# Patient Record
Sex: Male | Born: 1950 | Race: White | Hispanic: No | Marital: Single
Health system: Southern US, Community
[De-identification: ages and names within clinical notes are randomized; demographics above are authoritative.]

---

## 2016-02-18 ENCOUNTER — Other Ambulatory Visit (HOSPITAL_COMMUNITY): Payer: Medicare Other

## 2016-02-18 ENCOUNTER — Inpatient Hospital Stay
Admission: RE | Admit: 2016-02-18 | Discharge: 2016-03-27 | Disposition: A | Discharge status: Skilled Nursing Facility | Payer: Medicare Other | Source: Other Acute Inpatient Hospital | Attending: Urology | Admitting: Urology

## 2016-02-18 ENCOUNTER — Ambulatory Visit (HOSPITAL_COMMUNITY)
Admission: AD | Admit: 2016-02-18 | Discharge: 2016-02-18 | Disposition: A | Discharge status: Home / Self Care | Payer: Medicare Other | Source: Other Acute Inpatient Hospital | Attending: Internal Medicine | Admitting: Internal Medicine

## 2016-02-18 DIAGNOSIS — A419 Sepsis, unspecified organism: Secondary | ICD-10-CM

## 2016-02-18 DIAGNOSIS — Z01818 Encounter for other preprocedural examination: Secondary | ICD-10-CM

## 2016-02-18 DIAGNOSIS — Z931 Gastrostomy status: Secondary | ICD-10-CM

## 2016-02-18 DIAGNOSIS — Z978 Presence of other specified devices: Secondary | ICD-10-CM

## 2016-02-18 DIAGNOSIS — I639 Cerebral infarction, unspecified: Secondary | ICD-10-CM | POA: Diagnosis present

## 2016-02-18 DIAGNOSIS — R1110 Vomiting, unspecified: Secondary | ICD-10-CM

## 2016-02-18 DIAGNOSIS — J189 Pneumonia, unspecified organism: Secondary | ICD-10-CM

## 2016-02-18 LAB — BLOOD GAS, ARTERIAL
Acid-Base Excess: 0.7 mmol/L (ref 0.0–2.0)
Bicarbonate: 24.6 mmol/L (ref 20.0–28.0)
DRAWN BY: 24485
FIO2: 35
O2 SAT: 97.8 %
PATIENT TEMPERATURE: 98.6
PCO2 ART: 38.1 mmHg (ref 32.0–48.0)
PEEP: 5 cmH2O
PH ART: 7.427 (ref 7.350–7.450)
RATE: 14 resp/min
VT: 500 mL
pO2, Arterial: 101 mmHg (ref 83.0–108.0)

## 2016-02-18 LAB — URINALYSIS, ROUTINE W REFLEX MICROSCOPIC
BACTERIA UA: NONE SEEN
BILIRUBIN URINE: NEGATIVE
Glucose, UA: NEGATIVE mg/dL
Ketones, ur: NEGATIVE mg/dL
Leukocytes, UA: NEGATIVE
NITRITE: NEGATIVE
PH: 6 (ref 5.0–8.0)
Protein, ur: NEGATIVE mg/dL
SPECIFIC GRAVITY, URINE: 1.02 (ref 1.005–1.030)
Squamous Epithelial / LPF: NONE SEEN

## 2016-02-18 MED ORDER — IOPAMIDOL (ISOVUE-300) INJECTION 61%
INTRAVENOUS | Status: AC
  Administered 2016-02-18: 40 mL via GASTROSTOMY
  Filled 2016-02-18: qty 50

## 2016-02-19 LAB — COMPREHENSIVE METABOLIC PANEL
ALK PHOS: 47 U/L (ref 38–126)
ALT: 80 U/L — ABNORMAL HIGH (ref 17–63)
ANION GAP: 13 (ref 5–15)
AST: 84 U/L — ABNORMAL HIGH (ref 15–41)
Albumin: 3.3 g/dL — ABNORMAL LOW (ref 3.5–5.0)
BILIRUBIN TOTAL: 0.8 mg/dL (ref 0.3–1.2)
BUN: 28 mg/dL — ABNORMAL HIGH (ref 6–20)
CALCIUM: 9.7 mg/dL (ref 8.9–10.3)
CO2: 24 mmol/L (ref 22–32)
Chloride: 113 mmol/L — ABNORMAL HIGH (ref 101–111)
Creatinine, Ser: 0.88 mg/dL (ref 0.61–1.24)
Glucose, Bld: 151 mg/dL — ABNORMAL HIGH (ref 65–99)
Potassium: 4 mmol/L (ref 3.5–5.1)
SODIUM: 150 mmol/L — AB (ref 135–145)
TOTAL PROTEIN: 7.5 g/dL (ref 6.5–8.1)

## 2016-02-19 LAB — CBC WITH DIFFERENTIAL/PLATELET
BASOS ABS: 0.1 10*3/uL (ref 0.0–0.1)
Basophils Relative: 0 %
EOS PCT: 2 %
Eosinophils Absolute: 0.3 10*3/uL (ref 0.0–0.7)
HCT: 48.7 % (ref 39.0–52.0)
Hemoglobin: 15.4 g/dL (ref 13.0–17.0)
LYMPHS PCT: 12 %
Lymphs Abs: 1.7 10*3/uL (ref 0.7–4.0)
MCH: 31.8 pg (ref 26.0–34.0)
MCHC: 31.6 g/dL (ref 30.0–36.0)
MCV: 100.6 fL — AB (ref 78.0–100.0)
MONO ABS: 1.1 10*3/uL — AB (ref 0.1–1.0)
Monocytes Relative: 7 %
Neutro Abs: 11.5 10*3/uL — ABNORMAL HIGH (ref 1.7–7.7)
Neutrophils Relative %: 79 %
PLATELETS: 321 10*3/uL (ref 150–400)
RBC: 4.84 MIL/uL (ref 4.22–5.81)
RDW: 13.7 % (ref 11.5–15.5)
WBC: 14.7 10*3/uL — ABNORMAL HIGH (ref 4.0–10.5)

## 2016-02-19 LAB — MAGNESIUM: Magnesium: 2.5 mg/dL — ABNORMAL HIGH (ref 1.7–2.4)

## 2016-02-19 LAB — PROTIME-INR
INR: 1.01
Prothrombin Time: 13.3 seconds (ref 11.4–15.2)

## 2016-02-19 LAB — TSH: TSH: 1.998 u[IU]/mL (ref 0.350–4.500)

## 2016-02-19 LAB — PHOSPHORUS: PHOSPHORUS: 2.9 mg/dL (ref 2.5–4.6)

## 2016-02-20 LAB — HEMOGLOBIN A1C
HEMOGLOBIN A1C: 5.6 % (ref 4.8–5.6)
MEAN PLASMA GLUCOSE: 114 mg/dL

## 2016-02-20 LAB — URINE CULTURE: CULTURE: NO GROWTH

## 2016-02-20 LAB — BASIC METABOLIC PANEL
Anion gap: 10 (ref 5–15)
BUN: 26 mg/dL — AB (ref 6–20)
CALCIUM: 9.7 mg/dL (ref 8.9–10.3)
CO2: 24 mmol/L (ref 22–32)
CREATININE: 0.8 mg/dL (ref 0.61–1.24)
Chloride: 115 mmol/L — ABNORMAL HIGH (ref 101–111)
GFR calc Af Amer: >60 mL/min (ref >60)
Glucose, Bld: 174 mg/dL — ABNORMAL HIGH (ref 65–99)
POTASSIUM: 4 mmol/L (ref 3.5–5.1)
SODIUM: 149 mmol/L — AB (ref 135–145)

## 2016-02-21 LAB — RENAL FUNCTION PANEL
ANION GAP: 13 (ref 5–15)
Albumin: 3.1 g/dL — ABNORMAL LOW (ref 3.5–5.0)
BUN: 25 mg/dL — ABNORMAL HIGH (ref 6–20)
CO2: 24 mmol/L (ref 22–32)
Calcium: 9.4 mg/dL (ref 8.9–10.3)
Chloride: 109 mmol/L (ref 101–111)
Creatinine, Ser: 0.77 mg/dL (ref 0.61–1.24)
GFR calc non Af Amer: >60 mL/min (ref >60)
GLUCOSE: 161 mg/dL — AB (ref 65–99)
PHOSPHORUS: 2.6 mg/dL (ref 2.5–4.6)
POTASSIUM: 3.9 mmol/L (ref 3.5–5.1)
Sodium: 146 mmol/L — ABNORMAL HIGH (ref 135–145)

## 2016-02-21 LAB — CBC WITH DIFFERENTIAL/PLATELET
Basophils Absolute: 0 10*3/uL (ref 0.0–0.1)
Basophils Relative: 0 %
EOS ABS: 0.5 10*3/uL (ref 0.0–0.7)
Eosinophils Relative: 3 %
HEMATOCRIT: 47.9 % (ref 39.0–52.0)
Hemoglobin: 15.4 g/dL (ref 13.0–17.0)
LYMPHS ABS: 1.9 10*3/uL (ref 0.7–4.0)
LYMPHS PCT: 13 %
MCH: 32 pg (ref 26.0–34.0)
MCHC: 32.2 g/dL (ref 30.0–36.0)
MCV: 99.4 fL (ref 78.0–100.0)
Monocytes Absolute: 1.1 10*3/uL — ABNORMAL HIGH (ref 0.1–1.0)
Monocytes Relative: 8 %
NEUTROS ABS: 11.2 10*3/uL — AB (ref 1.7–7.7)
Neutrophils Relative %: 76 %
Platelets: 342 10*3/uL (ref 150–400)
RBC: 4.82 MIL/uL (ref 4.22–5.81)
RDW: 13.4 % (ref 11.5–15.5)
WBC: 14.7 10*3/uL — ABNORMAL HIGH (ref 4.0–10.5)

## 2016-02-21 LAB — MAGNESIUM: Magnesium: 2.3 mg/dL (ref 1.7–2.4)

## 2016-02-22 LAB — BASIC METABOLIC PANEL
Anion gap: 13 (ref 5–15)
BUN: 25 mg/dL — AB (ref 6–20)
CALCIUM: 9.5 mg/dL (ref 8.9–10.3)
CO2: 25 mmol/L (ref 22–32)
CREATININE: 0.87 mg/dL (ref 0.61–1.24)
Chloride: 104 mmol/L (ref 101–111)
GFR calc Af Amer: >60 mL/min (ref >60)
GLUCOSE: 182 mg/dL — AB (ref 65–99)
Potassium: 4 mmol/L (ref 3.5–5.1)
Sodium: 142 mmol/L (ref 135–145)

## 2016-02-23 ENCOUNTER — Other Ambulatory Visit (HOSPITAL_COMMUNITY): Payer: Medicare Other

## 2016-02-23 LAB — CULTURE, RESPIRATORY

## 2016-02-23 LAB — CULTURE, RESPIRATORY W GRAM STAIN: Culture: NORMAL

## 2016-02-24 ENCOUNTER — Other Ambulatory Visit (HOSPITAL_COMMUNITY): Payer: Medicare Other

## 2016-02-24 LAB — CBC
HEMATOCRIT: 47.2 % (ref 39.0–52.0)
Hemoglobin: 14.8 g/dL (ref 13.0–17.0)
MCH: 31.7 pg (ref 26.0–34.0)
MCHC: 31.4 g/dL (ref 30.0–36.0)
MCV: 101.1 fL — AB (ref 78.0–100.0)
Platelets: 324 10*3/uL (ref 150–400)
RBC: 4.67 MIL/uL (ref 4.22–5.81)
RDW: 13.7 % (ref 11.5–15.5)
WBC: 19.5 10*3/uL — AB (ref 4.0–10.5)

## 2016-02-24 LAB — BASIC METABOLIC PANEL
Anion gap: 11 (ref 5–15)
BUN: 54 mg/dL — ABNORMAL HIGH (ref 6–20)
CHLORIDE: 109 mmol/L (ref 101–111)
CO2: 23 mmol/L (ref 22–32)
CREATININE: 1.13 mg/dL (ref 0.61–1.24)
Calcium: 9.2 mg/dL (ref 8.9–10.3)
GFR calc non Af Amer: >60 mL/min (ref >60)
Glucose, Bld: 232 mg/dL — ABNORMAL HIGH (ref 65–99)
POTASSIUM: 4.7 mmol/L (ref 3.5–5.1)
SODIUM: 143 mmol/L (ref 135–145)

## 2016-02-24 LAB — C DIFFICILE QUICK SCREEN W PCR REFLEX
C DIFFICILE (CDIFF) INTERP: NOT DETECTED
C Diff antigen: NEGATIVE
C Diff toxin: NEGATIVE

## 2016-02-25 ENCOUNTER — Institutional Professional Consult (permissible substitution) (HOSPITAL_COMMUNITY): Payer: Medicare Other

## 2016-02-25 LAB — CBC WITH DIFFERENTIAL/PLATELET
BASOS ABS: 0.1 10*3/uL (ref 0.0–0.1)
Basophils Relative: 0 %
EOS ABS: 0.9 10*3/uL — AB (ref 0.0–0.7)
Eosinophils Relative: 5 %
HCT: 45.7 % (ref 39.0–52.0)
HEMOGLOBIN: 14.3 g/dL (ref 13.0–17.0)
Lymphocytes Relative: 6 %
Lymphs Abs: 1.3 10*3/uL (ref 0.7–4.0)
MCH: 31.8 pg (ref 26.0–34.0)
MCHC: 31.3 g/dL (ref 30.0–36.0)
MCV: 101.6 fL — ABNORMAL HIGH (ref 78.0–100.0)
Monocytes Absolute: 1.9 10*3/uL — ABNORMAL HIGH (ref 0.1–1.0)
Monocytes Relative: 10 %
NEUTROS PCT: 79 %
Neutro Abs: 15.5 10*3/uL — ABNORMAL HIGH (ref 1.7–7.7)
PLATELETS: 315 10*3/uL (ref 150–400)
RBC: 4.5 MIL/uL (ref 4.22–5.81)
RDW: 13.7 % (ref 11.5–15.5)
WBC: 19.5 10*3/uL — AB (ref 4.0–10.5)

## 2016-02-25 LAB — BASIC METABOLIC PANEL
ANION GAP: 12 (ref 5–15)
BUN: 69 mg/dL — ABNORMAL HIGH (ref 6–20)
CHLORIDE: 111 mmol/L (ref 101–111)
CO2: 25 mmol/L (ref 22–32)
Calcium: 9.3 mg/dL (ref 8.9–10.3)
Creatinine, Ser: 1.55 mg/dL — ABNORMAL HIGH (ref 0.61–1.24)
GFR calc Af Amer: 53 mL/min — ABNORMAL LOW (ref >60)
GFR calc non Af Amer: 45 mL/min — ABNORMAL LOW (ref >60)
Glucose, Bld: 241 mg/dL — ABNORMAL HIGH (ref 65–99)
POTASSIUM: 4.9 mmol/L (ref 3.5–5.1)
SODIUM: 148 mmol/L — AB (ref 135–145)

## 2016-02-25 LAB — PHOSPHORUS: PHOSPHORUS: 4.1 mg/dL (ref 2.5–4.6)

## 2016-02-25 LAB — MAGNESIUM: MAGNESIUM: 3.4 mg/dL — AB (ref 1.7–2.4)

## 2016-02-26 LAB — RENAL FUNCTION PANEL
ALBUMIN: 2.8 g/dL — AB (ref 3.5–5.0)
Albumin: 2.8 g/dL — ABNORMAL LOW (ref 3.5–5.0)
Anion gap: 13 (ref 5–15)
Anion gap: 13 (ref 5–15)
BUN: 79 mg/dL — AB (ref 6–20)
BUN: 84 mg/dL — AB (ref 6–20)
CALCIUM: 8.9 mg/dL (ref 8.9–10.3)
CHLORIDE: 110 mmol/L (ref 101–111)
CO2: 22 mmol/L (ref 22–32)
CO2: 24 mmol/L (ref 22–32)
CREATININE: 1.95 mg/dL — AB (ref 0.61–1.24)
CREATININE: 2.06 mg/dL — AB (ref 0.61–1.24)
Calcium: 9.2 mg/dL (ref 8.9–10.3)
Chloride: 111 mmol/L (ref 101–111)
GFR calc Af Amer: 40 mL/min — ABNORMAL LOW (ref >60)
GFR calc Af Amer: 37 mL/min — ABNORMAL LOW (ref >60)
GFR calc non Af Amer: 32 mL/min — ABNORMAL LOW (ref >60)
GFR, EST NON AFRICAN AMERICAN: 34 mL/min — AB (ref >60)
GLUCOSE: 239 mg/dL — AB (ref 65–99)
Glucose, Bld: 223 mg/dL — ABNORMAL HIGH (ref 65–99)
PHOSPHORUS: 4.4 mg/dL (ref 2.5–4.6)
PHOSPHORUS: 4.3 mg/dL (ref 2.5–4.6)
POTASSIUM: 5.1 mmol/L (ref 3.5–5.1)
Potassium: 5 mmol/L (ref 3.5–5.1)
Sodium: 146 mmol/L — ABNORMAL HIGH (ref 135–145)
Sodium: 147 mmol/L — ABNORMAL HIGH (ref 135–145)

## 2016-02-26 LAB — URINALYSIS, ROUTINE W REFLEX MICROSCOPIC
BACTERIA UA: NONE SEEN
Bilirubin Urine: NEGATIVE
Glucose, UA: 50 mg/dL — AB
Ketones, ur: NEGATIVE mg/dL
LEUKOCYTES UA: NEGATIVE
Nitrite: NEGATIVE
PROTEIN: NEGATIVE mg/dL
SQUAMOUS EPITHELIAL / LPF: NONE SEEN
Specific Gravity, Urine: 1.014 (ref 1.005–1.030)
pH: 6 (ref 5.0–8.0)

## 2016-02-26 LAB — MAGNESIUM: MAGNESIUM: 3.2 mg/dL — AB (ref 1.7–2.4)

## 2016-02-27 LAB — CULTURE, RESPIRATORY: CULTURE: NORMAL

## 2016-02-27 LAB — MAGNESIUM: Magnesium: 2.9 mg/dL — ABNORMAL HIGH (ref 1.7–2.4)

## 2016-02-27 LAB — C DIFFICILE QUICK SCREEN W PCR REFLEX
C DIFFICILE (CDIFF) INTERP: NOT DETECTED
C Diff antigen: NEGATIVE
C Diff toxin: NEGATIVE

## 2016-02-27 LAB — RENAL FUNCTION PANEL
ALBUMIN: 2.7 g/dL — AB (ref 3.5–5.0)
ANION GAP: 13 (ref 5–15)
BUN: 83 mg/dL — AB (ref 6–20)
CHLORIDE: 110 mmol/L (ref 101–111)
CO2: 24 mmol/L (ref 22–32)
Calcium: 9 mg/dL (ref 8.9–10.3)
Creatinine, Ser: 1.99 mg/dL — ABNORMAL HIGH (ref 0.61–1.24)
GFR calc Af Amer: 39 mL/min — ABNORMAL LOW (ref >60)
GFR calc non Af Amer: 33 mL/min — ABNORMAL LOW (ref >60)
GLUCOSE: 239 mg/dL — AB (ref 65–99)
PHOSPHORUS: 3.4 mg/dL (ref 2.5–4.6)
POTASSIUM: 5.1 mmol/L (ref 3.5–5.1)
Sodium: 147 mmol/L — ABNORMAL HIGH (ref 135–145)

## 2016-02-27 LAB — CULTURE, RESPIRATORY W GRAM STAIN

## 2016-02-27 LAB — URINE CULTURE: Culture: NO GROWTH

## 2016-02-28 LAB — BLOOD CULTURE ID PANEL (REFLEXED)
ACINETOBACTER BAUMANNII: NOT DETECTED
CANDIDA TROPICALIS: NOT DETECTED
Candida albicans: NOT DETECTED
Candida glabrata: NOT DETECTED
Candida krusei: NOT DETECTED
Candida parapsilosis: NOT DETECTED
ENTEROBACTERIACEAE SPECIES: NOT DETECTED
Enterobacter cloacae complex: NOT DETECTED
Enterococcus species: NOT DETECTED
Escherichia coli: NOT DETECTED
HAEMOPHILUS INFLUENZAE: NOT DETECTED
KLEBSIELLA OXYTOCA: NOT DETECTED
Klebsiella pneumoniae: NOT DETECTED
Listeria monocytogenes: NOT DETECTED
METHICILLIN RESISTANCE: NOT DETECTED
NEISSERIA MENINGITIDIS: NOT DETECTED
PSEUDOMONAS AERUGINOSA: NOT DETECTED
Proteus species: NOT DETECTED
SERRATIA MARCESCENS: NOT DETECTED
STAPHYLOCOCCUS AUREUS BCID: NOT DETECTED
STREPTOCOCCUS SPECIES: NOT DETECTED
Staphylococcus species: DETECTED — AB
Streptococcus agalactiae: NOT DETECTED
Streptococcus pneumoniae: NOT DETECTED
Streptococcus pyogenes: NOT DETECTED

## 2016-02-28 LAB — MAGNESIUM: MAGNESIUM: 2.3 mg/dL (ref 1.7–2.4)

## 2016-02-28 LAB — RENAL FUNCTION PANEL
ALBUMIN: 2.6 g/dL — AB (ref 3.5–5.0)
ANION GAP: 11 (ref 5–15)
ANION GAP: 10 (ref 5–15)
Albumin: 2.6 g/dL — ABNORMAL LOW (ref 3.5–5.0)
BUN: 56 mg/dL — ABNORMAL HIGH (ref 6–20)
BUN: 50 mg/dL — ABNORMAL HIGH (ref 6–20)
CALCIUM: 9.1 mg/dL (ref 8.9–10.3)
CO2: 25 mmol/L (ref 22–32)
CO2: 26 mmol/L (ref 22–32)
CREATININE: 1.22 mg/dL (ref 0.61–1.24)
CREATININE: 1.19 mg/dL (ref 0.61–1.24)
Calcium: 8.9 mg/dL (ref 8.9–10.3)
Chloride: 114 mmol/L — ABNORMAL HIGH (ref 101–111)
Chloride: 115 mmol/L — ABNORMAL HIGH (ref 101–111)
GFR calc non Af Amer: >60 mL/min (ref >60)
Glucose, Bld: 209 mg/dL — ABNORMAL HIGH (ref 65–99)
Glucose, Bld: 192 mg/dL — ABNORMAL HIGH (ref 65–99)
PHOSPHORUS: 2.8 mg/dL (ref 2.5–4.6)
POTASSIUM: 4.1 mmol/L (ref 3.5–5.1)
Phosphorus: 2.8 mg/dL (ref 2.5–4.6)
Potassium: 4 mmol/L (ref 3.5–5.1)
SODIUM: 151 mmol/L — AB (ref 135–145)
Sodium: 150 mmol/L — ABNORMAL HIGH (ref 135–145)

## 2016-02-29 LAB — CBC
HCT: 39 % (ref 39.0–52.0)
Hemoglobin: 12.1 g/dL — ABNORMAL LOW (ref 13.0–17.0)
MCH: 31.3 pg (ref 26.0–34.0)
MCHC: 31 g/dL (ref 30.0–36.0)
MCV: 101 fL — AB (ref 78.0–100.0)
PLATELETS: 275 10*3/uL (ref 150–400)
RBC: 3.86 MIL/uL — AB (ref 4.22–5.81)
RDW: 13.8 % (ref 11.5–15.5)
WBC: 15 10*3/uL — AB (ref 4.0–10.5)

## 2016-02-29 LAB — CULTURE, BLOOD (ROUTINE X 2)
Culture: NO GROWTH
Culture: NO GROWTH

## 2016-02-29 LAB — MAGNESIUM: MAGNESIUM: 1.9 mg/dL (ref 1.7–2.4)

## 2016-03-01 LAB — VANCOMYCIN, TROUGH: Vancomycin Tr: 23 ug/mL (ref 15–20)

## 2016-03-02 ENCOUNTER — Other Ambulatory Visit (HOSPITAL_COMMUNITY): Payer: Medicare Other

## 2016-03-02 LAB — URINALYSIS, ROUTINE W REFLEX MICROSCOPIC
Bacteria, UA: NONE SEEN
Bilirubin Urine: NEGATIVE
GLUCOSE, UA: NEGATIVE mg/dL
Ketones, ur: NEGATIVE mg/dL
NITRITE: NEGATIVE
PROTEIN: 30 mg/dL — AB
SPECIFIC GRAVITY, URINE: 1.023 (ref 1.005–1.030)
Squamous Epithelial / LPF: NONE SEEN
pH: 6 (ref 5.0–8.0)

## 2016-03-02 LAB — BASIC METABOLIC PANEL
Anion gap: 10 (ref 5–15)
Anion gap: 11 (ref 5–15)
BUN: 24 mg/dL — AB (ref 6–20)
BUN: 25 mg/dL — ABNORMAL HIGH (ref 6–20)
CO2: 23 mmol/L (ref 22–32)
CO2: 23 mmol/L (ref 22–32)
CREATININE: 0.88 mg/dL (ref 0.61–1.24)
CREATININE: 0.82 mg/dL (ref 0.61–1.24)
Calcium: 9.3 mg/dL (ref 8.9–10.3)
Calcium: 9.5 mg/dL (ref 8.9–10.3)
Chloride: 108 mmol/L (ref 101–111)
Chloride: 110 mmol/L (ref 101–111)
GFR calc Af Amer: >60 mL/min (ref >60)
GLUCOSE: 194 mg/dL — AB (ref 65–99)
GLUCOSE: 180 mg/dL — AB (ref 65–99)
Potassium: 3.8 mmol/L (ref 3.5–5.1)
Potassium: 3.8 mmol/L (ref 3.5–5.1)
Sodium: 141 mmol/L (ref 135–145)
Sodium: 144 mmol/L (ref 135–145)

## 2016-03-02 LAB — CBC WITH DIFFERENTIAL/PLATELET
BASOS ABS: 0 10*3/uL (ref 0.0–0.1)
BASOS PCT: 0 %
EOS ABS: 0.6 10*3/uL (ref 0.0–0.7)
EOS PCT: 3 %
HEMATOCRIT: 38.2 % — AB (ref 39.0–52.0)
Hemoglobin: 12.4 g/dL — ABNORMAL LOW (ref 13.0–17.0)
Lymphocytes Relative: 8 %
Lymphs Abs: 1.6 10*3/uL (ref 0.7–4.0)
MCH: 31.6 pg (ref 26.0–34.0)
MCHC: 32.5 g/dL (ref 30.0–36.0)
MCV: 97.2 fL (ref 78.0–100.0)
MONO ABS: 1 10*3/uL (ref 0.1–1.0)
MONOS PCT: 5 %
Neutro Abs: 16.2 10*3/uL — ABNORMAL HIGH (ref 1.7–7.7)
Neutrophils Relative %: 84 %
PLATELETS: 281 10*3/uL (ref 150–400)
RBC: 3.93 MIL/uL — ABNORMAL LOW (ref 4.22–5.81)
RDW: 13.1 % (ref 11.5–15.5)
WBC: 19.4 10*3/uL — ABNORMAL HIGH (ref 4.0–10.5)

## 2016-03-02 LAB — PROCALCITONIN
Procalcitonin: 0.14 ng/mL
Procalcitonin: 1.9 ng/mL

## 2016-03-02 LAB — MAGNESIUM
MAGNESIUM: 1.9 mg/dL (ref 1.7–2.4)
Magnesium: 1.9 mg/dL (ref 1.7–2.4)

## 2016-03-02 LAB — LACTIC ACID, PLASMA: LACTIC ACID, VENOUS: 1.5 mmol/L (ref 0.5–1.9)

## 2016-03-03 LAB — CULTURE, BLOOD (ROUTINE X 2): Culture: NO GROWTH

## 2016-03-03 LAB — URINE CULTURE: CULTURE: NO GROWTH

## 2016-03-03 LAB — CBC
HEMATOCRIT: 38.2 % — AB (ref 39.0–52.0)
HEMOGLOBIN: 12.2 g/dL — AB (ref 13.0–17.0)
MCH: 31.3 pg (ref 26.0–34.0)
MCHC: 31.9 g/dL (ref 30.0–36.0)
MCV: 97.9 fL (ref 78.0–100.0)
Platelets: 258 10*3/uL (ref 150–400)
RBC: 3.9 MIL/uL — AB (ref 4.22–5.81)
RDW: 13.3 % (ref 11.5–15.5)
WBC: 16.5 10*3/uL — AB (ref 4.0–10.5)

## 2016-03-04 ENCOUNTER — Encounter (HOSPITAL_COMMUNITY): Payer: Medicare Other | Admitting: Anesthesiology

## 2016-03-04 ENCOUNTER — Inpatient Hospital Stay: Admit: 2016-03-04 | Payer: Self-pay | Admitting: Otolaryngology

## 2016-03-04 ENCOUNTER — Encounter: Admission: RE | Disposition: A | Discharge status: Skilled Nursing Facility | Payer: Self-pay | Attending: Urology

## 2016-03-04 HISTORY — PX: TRACHEOSTOMY TUBE PLACEMENT: SHX814

## 2016-03-04 LAB — CULTURE, BLOOD (ROUTINE X 2): Culture: NO GROWTH

## 2016-03-04 LAB — CULTURE, RESPIRATORY W GRAM STAIN: Culture: NORMAL

## 2016-03-04 LAB — BASIC METABOLIC PANEL
Anion gap: 10 (ref 5–15)
BUN: 23 mg/dL — ABNORMAL HIGH (ref 6–20)
CALCIUM: 8.9 mg/dL (ref 8.9–10.3)
CO2: 23 mmol/L (ref 22–32)
CREATININE: 0.81 mg/dL (ref 0.61–1.24)
Chloride: 115 mmol/L — ABNORMAL HIGH (ref 101–111)
GFR calc non Af Amer: >60 mL/min (ref >60)
Glucose, Bld: 191 mg/dL — ABNORMAL HIGH (ref 65–99)
Potassium: 3.5 mmol/L (ref 3.5–5.1)
SODIUM: 148 mmol/L — AB (ref 135–145)

## 2016-03-04 LAB — CBC
HCT: 36.1 % — ABNORMAL LOW (ref 39.0–52.0)
Hemoglobin: 11.5 g/dL — ABNORMAL LOW (ref 13.0–17.0)
MCH: 31.5 pg (ref 26.0–34.0)
MCHC: 31.9 g/dL (ref 30.0–36.0)
MCV: 98.9 fL (ref 78.0–100.0)
Platelets: 254 10*3/uL (ref 150–400)
RBC: 3.65 MIL/uL — ABNORMAL LOW (ref 4.22–5.81)
RDW: 13.9 % (ref 11.5–15.5)
WBC: 15.7 10*3/uL — ABNORMAL HIGH (ref 4.0–10.5)

## 2016-03-04 LAB — PROTIME-INR
INR: 1.16
PROTHROMBIN TIME: 14.8 s (ref 11.4–15.2)

## 2016-03-04 LAB — VANCOMYCIN, TROUGH: Vancomycin Tr: 14 ug/mL — ABNORMAL LOW (ref 15–20)

## 2016-03-04 SURGERY — CREATION, TRACHEOSTOMY
Anesthesia: General

## 2016-03-04 MED ORDER — LIDOCAINE HCL (PF) 1 % IJ SOLN
INTRAMUSCULAR | Status: DC | PRN
  Administered 2016-03-04: 6 mL

## 2016-03-04 MED ORDER — MIDAZOLAM HCL 5 MG/5ML IJ SOLN
INTRAMUSCULAR | Status: DC | PRN
  Administered 2016-03-04: 2 mg via INTRAVENOUS

## 2016-03-04 MED ORDER — ROCURONIUM BROMIDE 10 MG/ML (PF) SYRINGE
PREFILLED_SYRINGE | INTRAVENOUS | Status: DC | PRN
  Administered 2016-03-04: 20 mg via INTRAVENOUS
  Administered 2016-03-04: 10 mg via INTRAVENOUS
  Administered 2016-03-04: 30 mg via INTRAVENOUS

## 2016-03-04 MED ORDER — FENTANYL CITRATE (PF) 250 MCG/5ML IJ SOLN
INTRAMUSCULAR | Status: AC
  Filled 2016-03-04: qty 5

## 2016-03-04 MED ORDER — MIDAZOLAM HCL 2 MG/2ML IJ SOLN
INTRAMUSCULAR | Status: AC
  Filled 2016-03-04: qty 2

## 2016-03-04 MED ORDER — PROPOFOL 10 MG/ML IV BOLUS
INTRAVENOUS | Status: AC
Start: 2016-03-04 — End: 2016-03-04
  Filled 2016-03-04: qty 20

## 2016-03-04 MED ORDER — PHENYLEPHRINE HCL 10 MG/ML IJ SOLN
INTRAMUSCULAR | Status: DC | PRN
  Administered 2016-03-04 (×2): 80 ug via INTRAVENOUS

## 2016-03-04 MED ORDER — FENTANYL CITRATE (PF) 250 MCG/5ML IJ SOLN
INTRAMUSCULAR | Status: DC | PRN
  Administered 2016-03-04: 100 ug via INTRAVENOUS
  Administered 2016-03-04 (×3): 50 ug via INTRAVENOUS

## 2016-03-04 MED ORDER — 0.9 % SODIUM CHLORIDE (POUR BTL) OPTIME
TOPICAL | Status: DC | PRN
  Administered 2016-03-04: 50 mL

## 2016-03-04 MED ORDER — LACTATED RINGERS IV SOLN
INTRAVENOUS | Status: DC | PRN
  Administered 2016-03-04: 08:00:00 via INTRAVENOUS

## 2016-03-04 MED ORDER — PHENYLEPHRINE 40 MCG/ML (10ML) SYRINGE FOR IV PUSH (FOR BLOOD PRESSURE SUPPORT)
PREFILLED_SYRINGE | INTRAVENOUS | Status: AC
  Filled 2016-03-04: qty 10

## 2016-03-04 MED ORDER — EPINEPHRINE PF 1 MG/ML IJ SOLN
INTRAMUSCULAR | Status: DC | PRN
  Administered 2016-03-04: 1 mg via ENDOTRACHEOPULMONARY

## 2016-03-04 SURGICAL SUPPLY — 40 items
ATTRACTOMAT 16X20 MAGNETIC DRP (DRAPES) IMPLANT
BLADE SURG 15 STRL LF DISP TIS (BLADE) ×1 IMPLANT
BLADE SURG 15 STRL SS (BLADE) ×2
CLEANER TIP ELECTROSURG 2X2 (MISCELLANEOUS) ×3 IMPLANT
COVER SURGICAL LIGHT HANDLE (MISCELLANEOUS) ×3 IMPLANT
DRAPE PROXIMA HALF (DRAPES) IMPLANT
ELECT COATED BLADE 2.86 ST (ELECTRODE) ×3 IMPLANT
ELECT REM PT RETURN 9FT ADLT (ELECTROSURGICAL) ×3
ELECTRODE REM PT RTRN 9FT ADLT (ELECTROSURGICAL) ×1 IMPLANT
GAUZE SPONGE 4X4 16PLY XRAY LF (GAUZE/BANDAGES/DRESSINGS) ×3 IMPLANT
GEL ULTRASOUND 20GR AQUASONIC (MISCELLANEOUS) ×3 IMPLANT
GLOVE SS BIOGEL STRL SZ 7.5 (GLOVE) ×1 IMPLANT
GLOVE SUPERSENSE BIOGEL SZ 7.5 (GLOVE) ×2
GOWN STRL REUS W/ TWL LRG LVL3 (GOWN DISPOSABLE) ×1 IMPLANT
GOWN STRL REUS W/ TWL XL LVL3 (GOWN DISPOSABLE) ×1 IMPLANT
GOWN STRL REUS W/TWL LRG LVL3 (GOWN DISPOSABLE) ×2
GOWN STRL REUS W/TWL XL LVL3 (GOWN DISPOSABLE) ×2
HOLDER TRACH TUBE VELCRO 19.5 (MISCELLANEOUS) ×3 IMPLANT
KIT BASIN OR (CUSTOM PROCEDURE TRAY) ×3 IMPLANT
KIT ROOM TURNOVER OR (KITS) ×3 IMPLANT
KIT SUCTION CATH 14FR (SUCTIONS) ×3 IMPLANT
NEEDLE HYPO 25GX1X1/2 BEV (NEEDLE) IMPLANT
NS IRRIG 1000ML POUR BTL (IV SOLUTION) ×3 IMPLANT
PACK EENT II TURBAN DRAPE (CUSTOM PROCEDURE TRAY) ×3 IMPLANT
PAD ARMBOARD 7.5X6 YLW CONV (MISCELLANEOUS) ×6 IMPLANT
PENCIL BUTTON HOLSTER BLD 10FT (ELECTRODE) ×3 IMPLANT
SPONGE DRAIN TRACH 4X4 STRL 2S (GAUZE/BANDAGES/DRESSINGS) ×3 IMPLANT
SPONGE INTESTINAL PEANUT (DISPOSABLE) ×3 IMPLANT
SUT SILK 2 0 SH CR/8 (SUTURE) ×3 IMPLANT
SUT SILK 3 0 TIES 10X30 (SUTURE) IMPLANT
SYR 5ML LUER SLIP (SYRINGE) ×3 IMPLANT
SYR CONTROL 10ML LL (SYRINGE) ×3 IMPLANT
TOWEL OR 17X24 6PK STRL BLUE (TOWEL DISPOSABLE) ×3 IMPLANT
TOWEL OR 17X26 10 PK STRL BLUE (TOWEL DISPOSABLE) ×3 IMPLANT
TUBE CONNECTING 12'X1/4 (SUCTIONS) ×1
TUBE CONNECTING 12X1/4 (SUCTIONS) ×2 IMPLANT
TUBE TRACH 7.0 EXL DIST CUF (TUBING) ×3 IMPLANT
TUBE TRACH SHILEY  6 DIST  CUF (TUBING) IMPLANT
TUBE TRACH SHILEY 10 DIST CUFF (TUBING) IMPLANT
TUBE TRACH SHILEY 8 DIST CUF (TUBING) IMPLANT

## 2016-03-04 NOTE — Anesthesia Preprocedure Evaluation (Addendum)
Anesthesia Evaluation  Patient identified by MRN, date of birth, ID band Patient awake    Reviewed: Allergy & Precautions, NPO status , Patient's Chart, lab work & pertinent test results  Airway Mallampati: Intubated       Dental no notable dental hx. (+) Dental Advisory Given   Pulmonary former smoker,    Pulmonary exam normal        Cardiovascular hypertension, Pt. on medications and Pt. on home beta blockers Normal cardiovascular exam     Neuro/Psych CVA    GI/Hepatic negative GI ROS, Neg liver ROS,   Endo/Other  negative endocrine ROS  Renal/GU negative Renal ROS  negative genitourinary   Musculoskeletal negative musculoskeletal ROS (+)   Abdominal Normal abdominal exam  (+)   Peds  Hematology negative hematology ROS (+)   Anesthesia Other Findings   Reproductive/Obstetrics                            Anesthesia Physical Anesthesia Plan  ASA: III  Anesthesia Plan: General   Post-op Pain Management:    Induction: Inhalational  Airway Management Planned: Oral ETT and Tracheostomy  Additional Equipment:   Intra-op Plan:   Post-operative Plan: Post-operative intubation/ventilation  Informed Consent: I have reviewed the patients History and Physical, chart, labs and discussed the procedure including the risks, benefits and alternatives for the proposed anesthesia with the patient or authorized representative who has indicated his/her understanding and acceptance.   Dental advisory given  Plan Discussed with: Anesthesiologist, Surgeon and CRNA  Anesthesia Plan Comments:        Anesthesia Quick Evaluation

## 2016-03-04 NOTE — Brief Op Note (Signed)
02/18/2016 - 03/04/2016  9:58 AM  PATIENT:  Randell PatientErnest Formoso  66 y.o. male  PRE-OPERATIVE DIAGNOSIS:  prolonged intubation  POST-OPERATIVE DIAGNOSIS:  prolonged intubation  PROCEDURE:  Procedure(s): TRACHEOSTOMY (N/A) 70 distal XLT trach  SURGEON:  Surgeon(s) and Role:    * Drema Halonhristopher E Wanita Derenzo, MD - Primary  PHYSICIAN ASSISTANT:   ASSISTANTS: none   ANESTHESIA:   general  EBL:  Total I/O In: 600 [I.V.:600] Out: 5 [Blood:5]  BLOOD ADMINISTERED:none  DRAINS: none   LOCAL MEDICATIONS USED:  XYLOCAINE wit EPI 5cc  SPECIMEN:  No Specimen  DISPOSITION OF SPECIMEN:  N/A  COUNTS:  YES  TOURNIQUET:  * No tourniquets in log *  DICTATION: .Other Dictation: Dictation Number [id number]  PLAN OF CARE: Discharge to home after PACU  PATIENT DISPOSITION:  PACU - hemodynamically stable.   Delay start of Pharmacological VTE agent (>24hrs) due to surgical blood loss or risk of bleeding: not applicable

## 2016-03-04 NOTE — Transfer of Care (Signed)
Immediate Anesthesia Transfer of Care Note  Patient: Colin [name]  Procedure(s) Performed: Procedure(s): TRACHEOSTOMY (N/A)  Patient Location: Nursing Unit  Anesthesia Type:General  Level of Consciousness: sedated  Airway & Oxygen Therapy: Patient remains intubated per anesthesia plan and Patient placed on Ventilator (see vital sign flow sheet for setting)  Post-op Assessment: Post -op Vital signs reviewed and stable  Post vital signs: Reviewed and stable  Last Vitals: There were no vitals filed for this visit.  Last Pain: There were no vitals filed for this visit.       Complications: No apparent anesthesia complications

## 2016-03-04 NOTE — Anesthesia Postprocedure Evaluation (Addendum)
Anesthesia Post Note  Patient: Colin [name]  Procedure(s) Performed: Procedure(s) (LRB): TRACHEOSTOMY (N/A)  Patient location during evaluation: Other Anesthesia Type: General Level of consciousness: obtunded/minimal responses Pain management: pain level controlled Vital Signs Assessment: post-procedure vital signs reviewed and stable Respiratory status: patient connected to tracheostomy mask oxygen Cardiovascular status: stable Postop Assessment: no signs of nausea or vomiting Anesthetic complications: no        Last Vitals: There were no vitals filed for this visit.  Last Pain: There were no vitals filed for this visit. Pain Goal:                 Amra Shukla JR,JOHN Dori Devino

## 2016-03-05 ENCOUNTER — Encounter (HOSPITAL_COMMUNITY): Payer: Self-pay | Admitting: Otolaryngology

## 2016-03-05 NOTE — Op Note (Signed)
NAMRandell Patient:  [name], Colin [name]             ACCOUNT NO.:  [id number]  MEDICAL RECORD NO.:  [id number]  LOCATION:                                 FACILITY:  PHYSICIAN:  Kristine GarbeChristopher E. Ezzard StandingNewman, M.D.DATE OF BIRTH:  Apr 28, 1950  DATE OF PROCEDURE:  03/04/2016 DATE OF DISCHARGE:                              OPERATIVE REPORT   PREOPERATIVE DIAGNOSIS:  Acute on chronic respiratory failure.  POSTOPERATIVE DIAGNOSIS:  Acute on chronic respiratory failure.  OPERATION PERFORMED:   Tracheostomy with a 70 distal XLT trach.  SURGEON:  Kristine GarbeChristopher E. Ezzard StandingNewman, M.D.  ANESTHESIA:  General endotracheal.  COMPLICATIONS:  None.  BRIEF CLINICAL NOTE:  Colin [name] is a 66 year old gentleman, who has been [facility] about 2-1/2 weeks.  He was transferred from an outside hospital after undergoing an initial tracheostomy on February 15, 2016.  Shortly after [facility] on his second or third postoperative day, the tracheostomy tube came out and it was unable to be replaced and the patient was subsequently orally intubated.  It came out on February 23, 2016, and also consulted a week later concerning replacing the trach. The patient is presently orally intubated on a ventilator.  He has had previous history of recent stroke with right body weakness and dysphagia.  The patient is taken to the operating room this time for replacement of tracheostomy that been dislodged now in 10 days.  DESCRIPTION OF PROCEDURE:  The patient was brought straight from [address] down to the operating room.  Neck was prepped with Betadine solution.  A roll was placed underneath the shoulders to extend his neck.  The previous tracheal incision was used.  There was a fair amount of granulation tissue in this incision and this was opened up with a cautery.  Dissection was carried down to the trach and I could visualize the small hole in the trach where the previous tracheostomy tube  was placed.  A 70 distal XLT tracheostomy tube was utilized.  I was really unable to expose the tracheal rings.  A cricoid hook was placed underneath the cricoid cartilage to elevate and stabilize the trachea.  There is a fair amount of distance from the skin edge down to the old tracheostomy site and it was elected to use an XLT distal 70 trach as the previous tracheostomy tube  had accidentally been dislodged.  The endotracheal tube was removed. A bougie was passed down through the opening of the previous tracheostomy and the new 70 distal XLT trach was passed over the bougie into the trachea.  The patient was ventilated well and the trach was secured with a 2-0 silk sutures x4 and trach Velcro tape around the neck.  There was minimal bleeding.  The patient was subsequently transferred back to [facility].    ______________________________ Kristine Garbehristopher E. Ezzard StandingNewman, M.D.   ______________________________ Kristine Garbehristopher E. Ezzard StandingNewman, M.D.    CEN/MEDQ  D:  03/04/2016  T:  03/04/2016  Job:  [id number]

## 2016-03-07 LAB — CULTURE, BLOOD (ROUTINE X 2)
Culture: NO GROWTH
Culture: NO GROWTH

## 2016-03-08 LAB — CBC
HCT: 37.5 % — ABNORMAL LOW (ref 39.0–52.0)
Hemoglobin: 11.8 g/dL — ABNORMAL LOW (ref 13.0–17.0)
MCH: 31.6 pg (ref 26.0–34.0)
MCHC: 31.5 g/dL (ref 30.0–36.0)
MCV: 100.5 fL — AB (ref 78.0–100.0)
PLATELETS: 197 10*3/uL (ref 150–400)
RBC: 3.73 MIL/uL — AB (ref 4.22–5.81)
RDW: 13.8 % (ref 11.5–15.5)
WBC: 14.5 10*3/uL — AB (ref 4.0–10.5)

## 2016-03-08 LAB — BASIC METABOLIC PANEL
ANION GAP: 9 (ref 5–15)
BUN: 23 mg/dL — ABNORMAL HIGH (ref 6–20)
CALCIUM: 9.1 mg/dL (ref 8.9–10.3)
CO2: 25 mmol/L (ref 22–32)
Chloride: 115 mmol/L — ABNORMAL HIGH (ref 101–111)
Creatinine, Ser: 0.65 mg/dL (ref 0.61–1.24)
Glucose, Bld: 138 mg/dL — ABNORMAL HIGH (ref 65–99)
POTASSIUM: 3.9 mmol/L (ref 3.5–5.1)
SODIUM: 149 mmol/L — AB (ref 135–145)

## 2016-03-11 LAB — BASIC METABOLIC PANEL
Anion gap: 8 (ref 5–15)
BUN: 23 mg/dL — ABNORMAL HIGH (ref 6–20)
CHLORIDE: 111 mmol/L (ref 101–111)
CO2: 29 mmol/L (ref 22–32)
CREATININE: 0.68 mg/dL (ref 0.61–1.24)
Calcium: 9.5 mg/dL (ref 8.9–10.3)
GFR calc non Af Amer: >60 mL/min (ref >60)
Glucose, Bld: 138 mg/dL — ABNORMAL HIGH (ref 65–99)
POTASSIUM: 4.1 mmol/L (ref 3.5–5.1)
Sodium: 148 mmol/L — ABNORMAL HIGH (ref 135–145)

## 2016-03-11 LAB — BLOOD GAS, ARTERIAL
ACID-BASE EXCESS: 3.8 mmol/L — AB (ref 0.0–2.0)
Bicarbonate: 27.9 mmol/L (ref 20.0–28.0)
FIO2: 28
O2 SAT: 97.3 %
PATIENT TEMPERATURE: 98.6
PH ART: 7.428 (ref 7.350–7.450)
pCO2 arterial: 43.1 mmHg (ref 32.0–48.0)
pO2, Arterial: 90.5 mmHg (ref 83.0–108.0)

## 2016-03-11 LAB — CBC WITH DIFFERENTIAL/PLATELET
BASOS PCT: 0 %
Basophils Absolute: 0 10*3/uL (ref 0.0–0.1)
Eosinophils Absolute: 0.6 10*3/uL (ref 0.0–0.7)
Eosinophils Relative: 5 %
HEMATOCRIT: 41.3 % (ref 39.0–52.0)
Hemoglobin: 13 g/dL (ref 13.0–17.0)
LYMPHS ABS: 1.9 10*3/uL (ref 0.7–4.0)
Lymphocytes Relative: 14 %
MCH: 31.7 pg (ref 26.0–34.0)
MCHC: 31.5 g/dL (ref 30.0–36.0)
MCV: 100.7 fL — ABNORMAL HIGH (ref 78.0–100.0)
MONOS PCT: 6 %
Monocytes Absolute: 0.8 10*3/uL (ref 0.1–1.0)
NEUTROS ABS: 10.6 10*3/uL — AB (ref 1.7–7.7)
NEUTROS PCT: 75 %
Platelets: 193 10*3/uL (ref 150–400)
RBC: 4.1 MIL/uL — AB (ref 4.22–5.81)
RDW: 14 % (ref 11.5–15.5)
WBC: 13.9 10*3/uL — ABNORMAL HIGH (ref 4.0–10.5)

## 2016-03-11 LAB — MAGNESIUM: Magnesium: 2.2 mg/dL (ref 1.7–2.4)

## 2016-03-11 LAB — PHOSPHORUS: PHOSPHORUS: 3.1 mg/dL (ref 2.5–4.6)

## 2016-03-12 LAB — BASIC METABOLIC PANEL
ANION GAP: 8 (ref 5–15)
BUN: 22 mg/dL — AB (ref 6–20)
CALCIUM: 9.6 mg/dL (ref 8.9–10.3)
CO2: 27 mmol/L (ref 22–32)
CREATININE: 0.64 mg/dL (ref 0.61–1.24)
Chloride: 110 mmol/L (ref 101–111)
GFR calc Af Amer: >60 mL/min (ref >60)
GFR calc non Af Amer: >60 mL/min (ref >60)
GLUCOSE: 142 mg/dL — AB (ref 65–99)
Potassium: 3.6 mmol/L (ref 3.5–5.1)
Sodium: 145 mmol/L (ref 135–145)

## 2016-03-18 LAB — CBC WITH DIFFERENTIAL/PLATELET
Basophils Absolute: 0.1 10*3/uL (ref 0.0–0.1)
Basophils Relative: 1 %
EOS ABS: 0.4 10*3/uL (ref 0.0–0.7)
EOS PCT: 3 %
HCT: 44.9 % (ref 39.0–52.0)
HEMOGLOBIN: 14 g/dL (ref 13.0–17.0)
LYMPHS ABS: 1.9 10*3/uL (ref 0.7–4.0)
Lymphocytes Relative: 16 %
MCH: 31.9 pg (ref 26.0–34.0)
MCHC: 31.2 g/dL (ref 30.0–36.0)
MCV: 102.3 fL — ABNORMAL HIGH (ref 78.0–100.0)
MONO ABS: 1.1 10*3/uL — AB (ref 0.1–1.0)
MONOS PCT: 9 %
Neutro Abs: 8.9 10*3/uL — ABNORMAL HIGH (ref 1.7–7.7)
Neutrophils Relative %: 71 %
PLATELETS: 186 10*3/uL (ref 150–400)
RBC: 4.39 MIL/uL (ref 4.22–5.81)
RDW: 14.5 % (ref 11.5–15.5)
WBC: 12.3 10*3/uL — ABNORMAL HIGH (ref 4.0–10.5)

## 2016-03-18 LAB — BASIC METABOLIC PANEL
Anion gap: 12 (ref 5–15)
BUN: 33 mg/dL — AB (ref 6–20)
CALCIUM: 10 mg/dL (ref 8.9–10.3)
CO2: 27 mmol/L (ref 22–32)
Chloride: 108 mmol/L (ref 101–111)
Creatinine, Ser: 0.76 mg/dL (ref 0.61–1.24)
GFR calc Af Amer: >60 mL/min (ref >60)
GFR calc non Af Amer: >60 mL/min (ref >60)
GLUCOSE: 156 mg/dL — AB (ref 65–99)
Potassium: 4.1 mmol/L (ref 3.5–5.1)
Sodium: 147 mmol/L — ABNORMAL HIGH (ref 135–145)

## 2016-03-18 LAB — PHOSPHORUS: Phosphorus: 3.4 mg/dL (ref 2.5–4.6)

## 2016-03-18 LAB — MAGNESIUM: Magnesium: 2.4 mg/dL (ref 1.7–2.4)

## 2016-03-19 LAB — BASIC METABOLIC PANEL
Anion gap: 10 (ref 5–15)
BUN: 34 mg/dL — ABNORMAL HIGH (ref 6–20)
CHLORIDE: 108 mmol/L (ref 101–111)
CO2: 27 mmol/L (ref 22–32)
Calcium: 10.1 mg/dL (ref 8.9–10.3)
Creatinine, Ser: 0.83 mg/dL (ref 0.61–1.24)
GFR calc non Af Amer: >60 mL/min (ref >60)
Glucose, Bld: 158 mg/dL — ABNORMAL HIGH (ref 65–99)
POTASSIUM: 4.3 mmol/L (ref 3.5–5.1)
SODIUM: 145 mmol/L (ref 135–145)

## 2016-03-20 LAB — BASIC METABOLIC PANEL
ANION GAP: 11 (ref 5–15)
BUN: 36 mg/dL — ABNORMAL HIGH (ref 6–20)
CALCIUM: 9.7 mg/dL (ref 8.9–10.3)
CO2: 26 mmol/L (ref 22–32)
Chloride: 110 mmol/L (ref 101–111)
Creatinine, Ser: 0.8 mg/dL (ref 0.61–1.24)
Glucose, Bld: 138 mg/dL — ABNORMAL HIGH (ref 65–99)
POTASSIUM: 4.1 mmol/L (ref 3.5–5.1)
SODIUM: 147 mmol/L — AB (ref 135–145)

## 2016-03-21 LAB — BASIC METABOLIC PANEL
ANION GAP: 10 (ref 5–15)
BUN: 36 mg/dL — ABNORMAL HIGH (ref 6–20)
CO2: 26 mmol/L (ref 22–32)
Calcium: 9.7 mg/dL (ref 8.9–10.3)
Chloride: 109 mmol/L (ref 101–111)
Creatinine, Ser: 0.78 mg/dL (ref 0.61–1.24)
Glucose, Bld: 153 mg/dL — ABNORMAL HIGH (ref 65–99)
POTASSIUM: 4.2 mmol/L (ref 3.5–5.1)
Sodium: 145 mmol/L (ref 135–145)

## 2016-03-22 LAB — BASIC METABOLIC PANEL
ANION GAP: 9 (ref 5–15)
BUN: 32 mg/dL — ABNORMAL HIGH (ref 6–20)
CHLORIDE: 105 mmol/L (ref 101–111)
CO2: 30 mmol/L (ref 22–32)
Calcium: 9.5 mg/dL (ref 8.9–10.3)
Creatinine, Ser: 0.69 mg/dL (ref 0.61–1.24)
GFR calc non Af Amer: >60 mL/min (ref >60)
Glucose, Bld: 144 mg/dL — ABNORMAL HIGH (ref 65–99)
POTASSIUM: 4.3 mmol/L (ref 3.5–5.1)
SODIUM: 144 mmol/L (ref 135–145)

## 2016-03-23 ENCOUNTER — Other Ambulatory Visit (HOSPITAL_COMMUNITY): Payer: Medicare Other

## 2016-03-23 LAB — BASIC METABOLIC PANEL
Anion gap: 6 (ref 5–15)
BUN: 26 mg/dL — AB (ref 6–20)
CHLORIDE: 103 mmol/L (ref 101–111)
CO2: 29 mmol/L (ref 22–32)
CREATININE: 0.66 mg/dL (ref 0.61–1.24)
Calcium: 9.2 mg/dL (ref 8.9–10.3)
GFR calc Af Amer: >60 mL/min (ref >60)
Glucose, Bld: 126 mg/dL — ABNORMAL HIGH (ref 65–99)
Potassium: 4.7 mmol/L (ref 3.5–5.1)
SODIUM: 138 mmol/L (ref 135–145)

## 2016-03-24 ENCOUNTER — Other Ambulatory Visit (HOSPITAL_COMMUNITY): Payer: Medicare Other

## 2016-03-24 LAB — BASIC METABOLIC PANEL
ANION GAP: 8 (ref 5–15)
BUN: 18 mg/dL (ref 6–20)
CO2: 28 mmol/L (ref 22–32)
Calcium: 9.2 mg/dL (ref 8.9–10.3)
Chloride: 100 mmol/L — ABNORMAL LOW (ref 101–111)
Creatinine, Ser: 0.77 mg/dL (ref 0.61–1.24)
GLUCOSE: 161 mg/dL — AB (ref 65–99)
POTASSIUM: 4.1 mmol/L (ref 3.5–5.1)
Sodium: 136 mmol/L (ref 135–145)

## 2016-03-25 LAB — BASIC METABOLIC PANEL
Anion gap: 12 (ref 5–15)
BUN: 18 mg/dL (ref 6–20)
CALCIUM: 9.7 mg/dL (ref 8.9–10.3)
CO2: 28 mmol/L (ref 22–32)
CREATININE: 0.61 mg/dL (ref 0.61–1.24)
Chloride: 101 mmol/L (ref 101–111)
GFR calc Af Amer: >60 mL/min (ref >60)
Glucose, Bld: 158 mg/dL — ABNORMAL HIGH (ref 65–99)
Potassium: 4.1 mmol/L (ref 3.5–5.1)
SODIUM: 141 mmol/L (ref 135–145)

## 2016-06-20 NOTE — Addendum Note (Signed)
Addendum  created 06/20/16 1028 by Wilhemenia Camba, MD   Sign clinical note    

## 2016-08-13 DEATH — deceased

## 2018-03-24 IMAGING — CR DG ABD PORTABLE 1V
1 series · 1 of 1 positions shown · non-contrast
Comparison: None.

CLINICAL DATA: Possible malposition of the indwelling percutaneous
gastrostomy tube.

EXAM:
PORTABLE ABDOMEN - 1 VIEW

[AP]
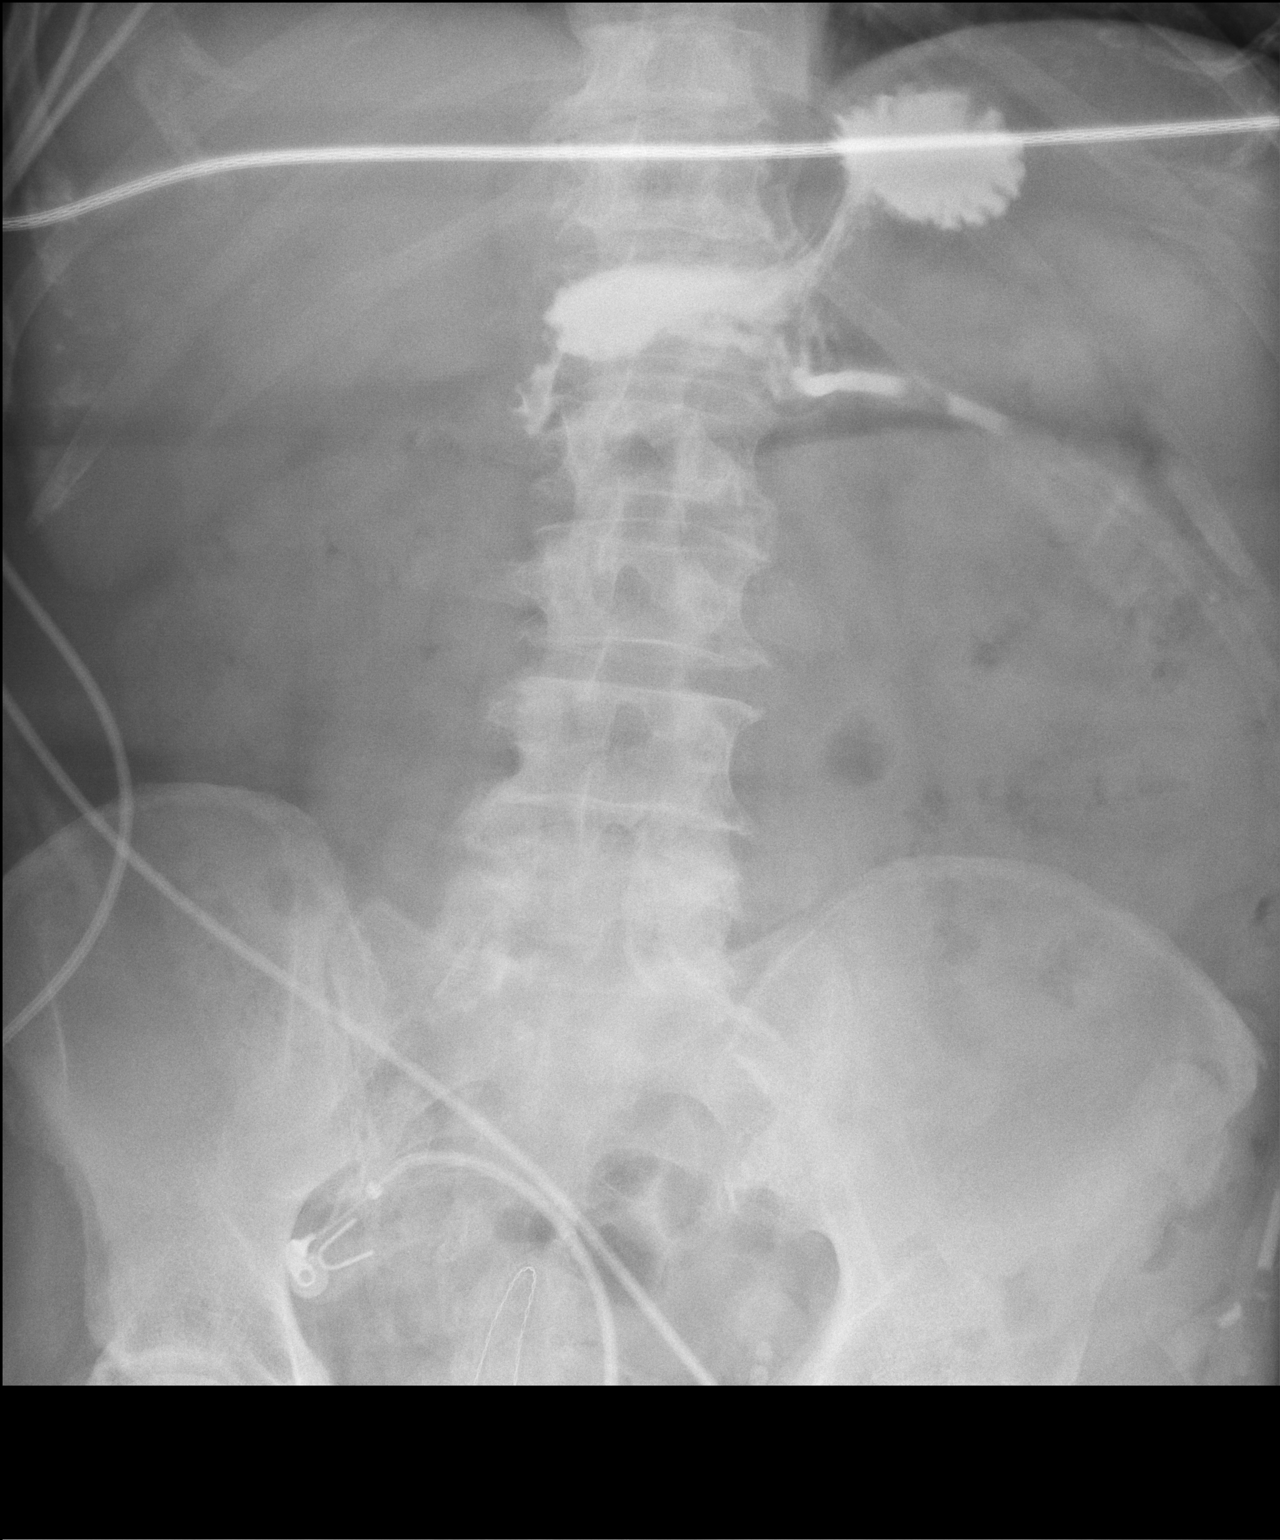

[1 of 1 positions shown; findings below may reference images not displayed]

FINDINGS: Approximately 40 mL of 5sovue-VJJ were administered into the
nasogastric tube by the radiologic technologist. No evidence of
contrast leak. All of the contrast is within the decompressed
stomach. The tip of the tube is in the mid body of the stomach.

Bowel gas pattern unremarkable without evidence of obstruction or
significant ileus. Numerous pelvic phleboliths. No visible opaque
urinary tract calculi. Degenerative changes involving the thoracic
and lumbar spine.
IMPRESSION: 1. Percutaneous gastrostomy tube in appropriate position within the
mid body of the stomach. No evidence of contrast leak.
2. No acute abdominal abnormality.

## 2018-03-29 IMAGING — CR DG CHEST 1V PORT
1 series · 1 of 1 positions shown · non-contrast
Comparison: February 23, 2016

CLINICAL DATA: ETT adjustment.

EXAM:
PORTABLE CHEST 1 VIEW

[AP]
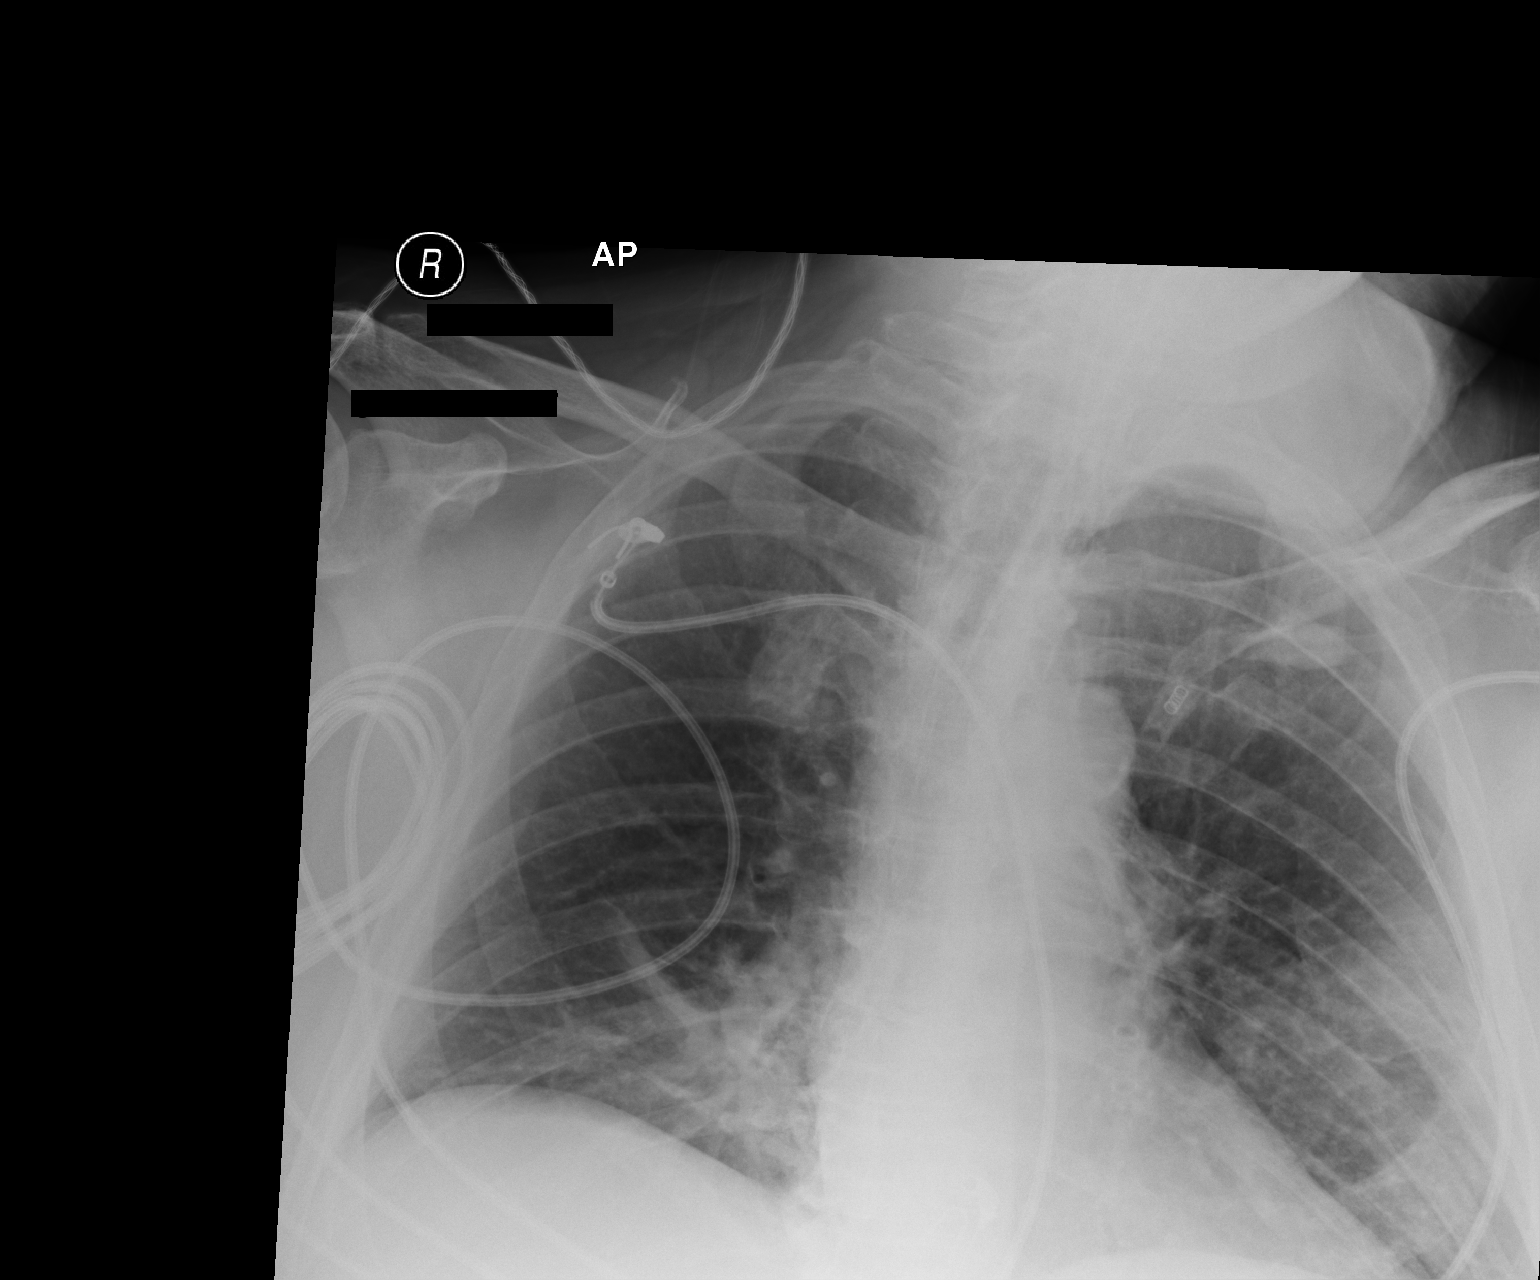

[1 of 1 positions shown; findings below may reference images not displayed]

FINDINGS: No pneumothorax. The ETT appears terminate in the mid trachea.
Persistent infiltrate in medial right lung base. New infiltrate in
the left lung base. No other changes.
IMPRESSION: 1. The ETT is in good position.
2. Stable infiltrate in the medial right lung base.
3. New infiltrate in the left lung base.

## 2018-03-31 IMAGING — CR DG CHEST 1V PORT
2 series · 2 of 2 positions shown · non-contrast
Comparison: Portable chest x-ray February 23, 2016

CLINICAL DATA: Clinical pneumonia.

EXAM:
PORTABLE CHEST 1 VIEW

[AP (1 of 2)]
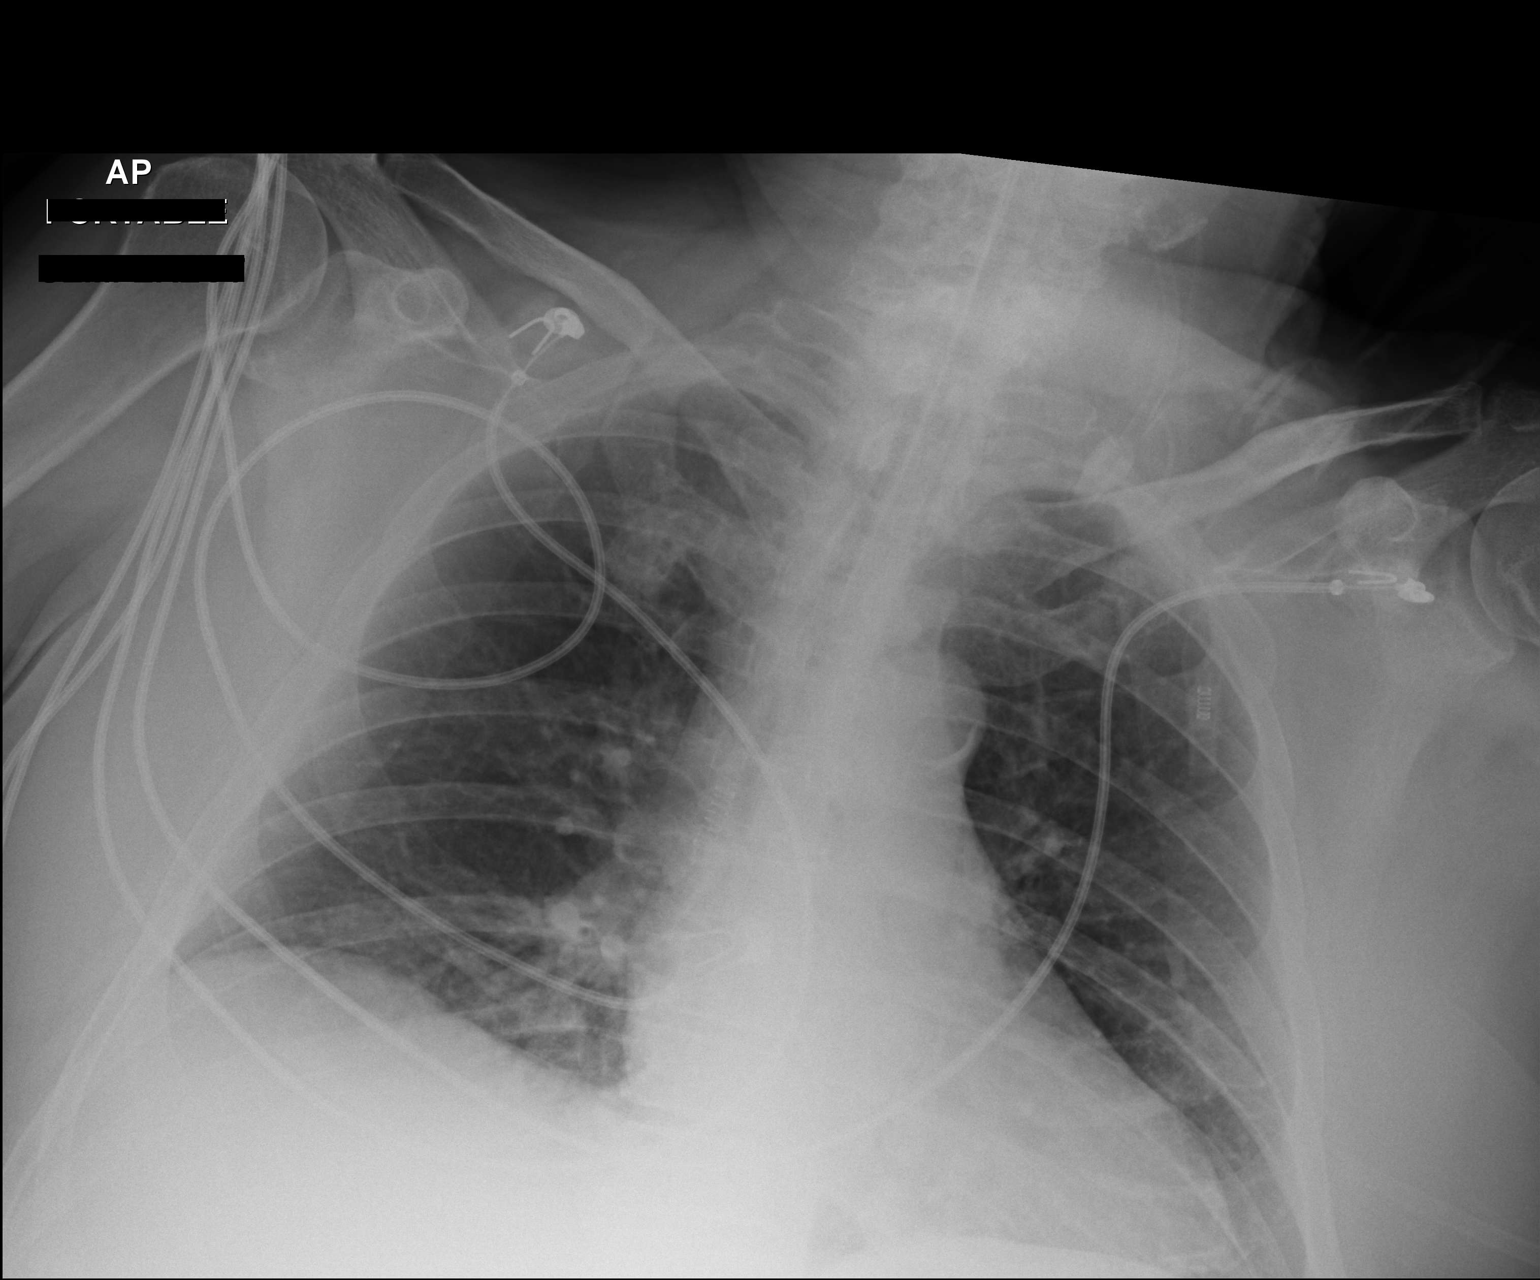

[AP (2 of 2)]
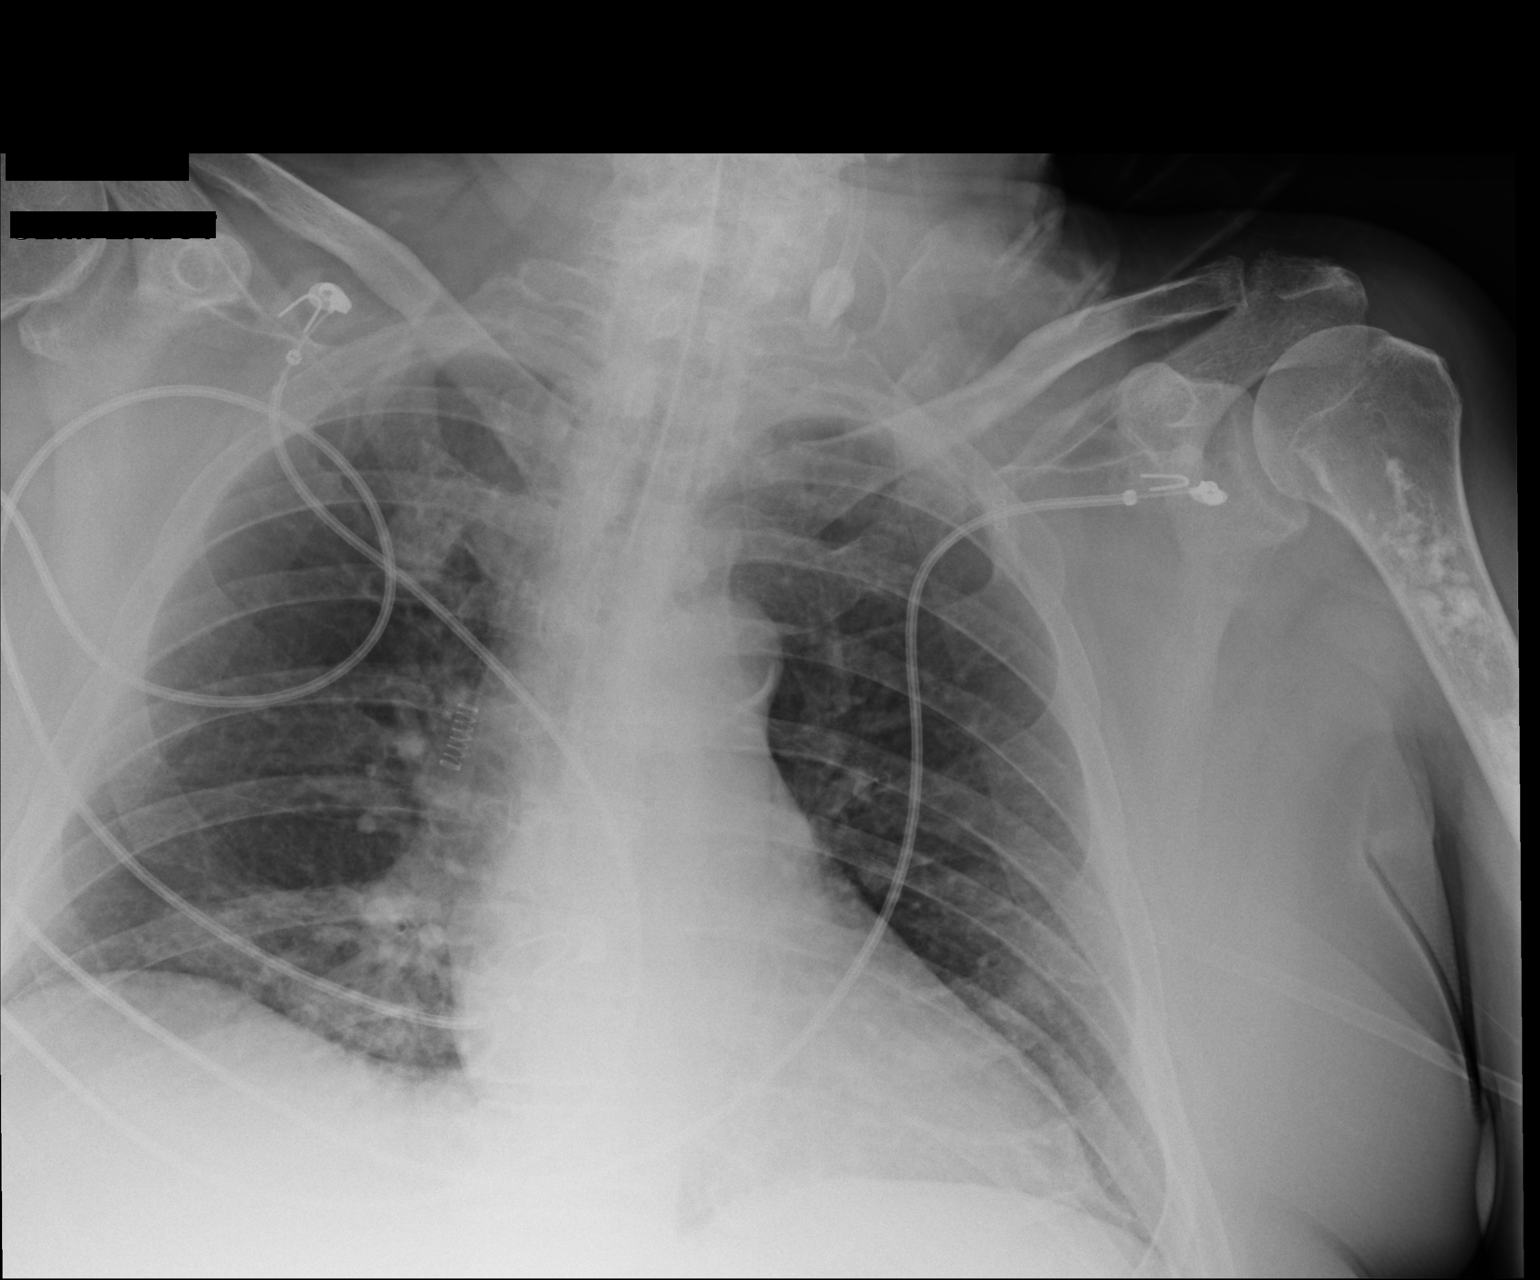

[2 of 2 positions shown; findings below may reference images not displayed]

FINDINGS: The lungs are reasonably well inflated. There is persistent hazy
increased density at the right lung base. The left lung is clear.
The heart and pulmonary vascularity are normal. There is
calcification in the wall of the aortic arch. The endotracheal tube
tip projects approximately 4.3 cm above the carina. The observed
bony thorax exhibits no acute abnormality. Chronic increased density
in the proximal humeral shaft is consistent with an enchondroma or
old bone infarct.
IMPRESSION: Minimal residual atelectasis or infiltrate at the right lung base.
No CHF.

Thoracic aortic atherosclerosis.

## 2018-04-27 IMAGING — CR DG CHEST 1V PORT
2 series · 2 of 2 positions shown · non-contrast
Comparison: 03/02/2016 and earlier.

CLINICAL DATA: 65-year-old male status post tracheostomy tube in
[REDACTED] after prolonged intubation.

EXAM:
PORTABLE CHEST 1 VIEW

[AP (1 of 2)]
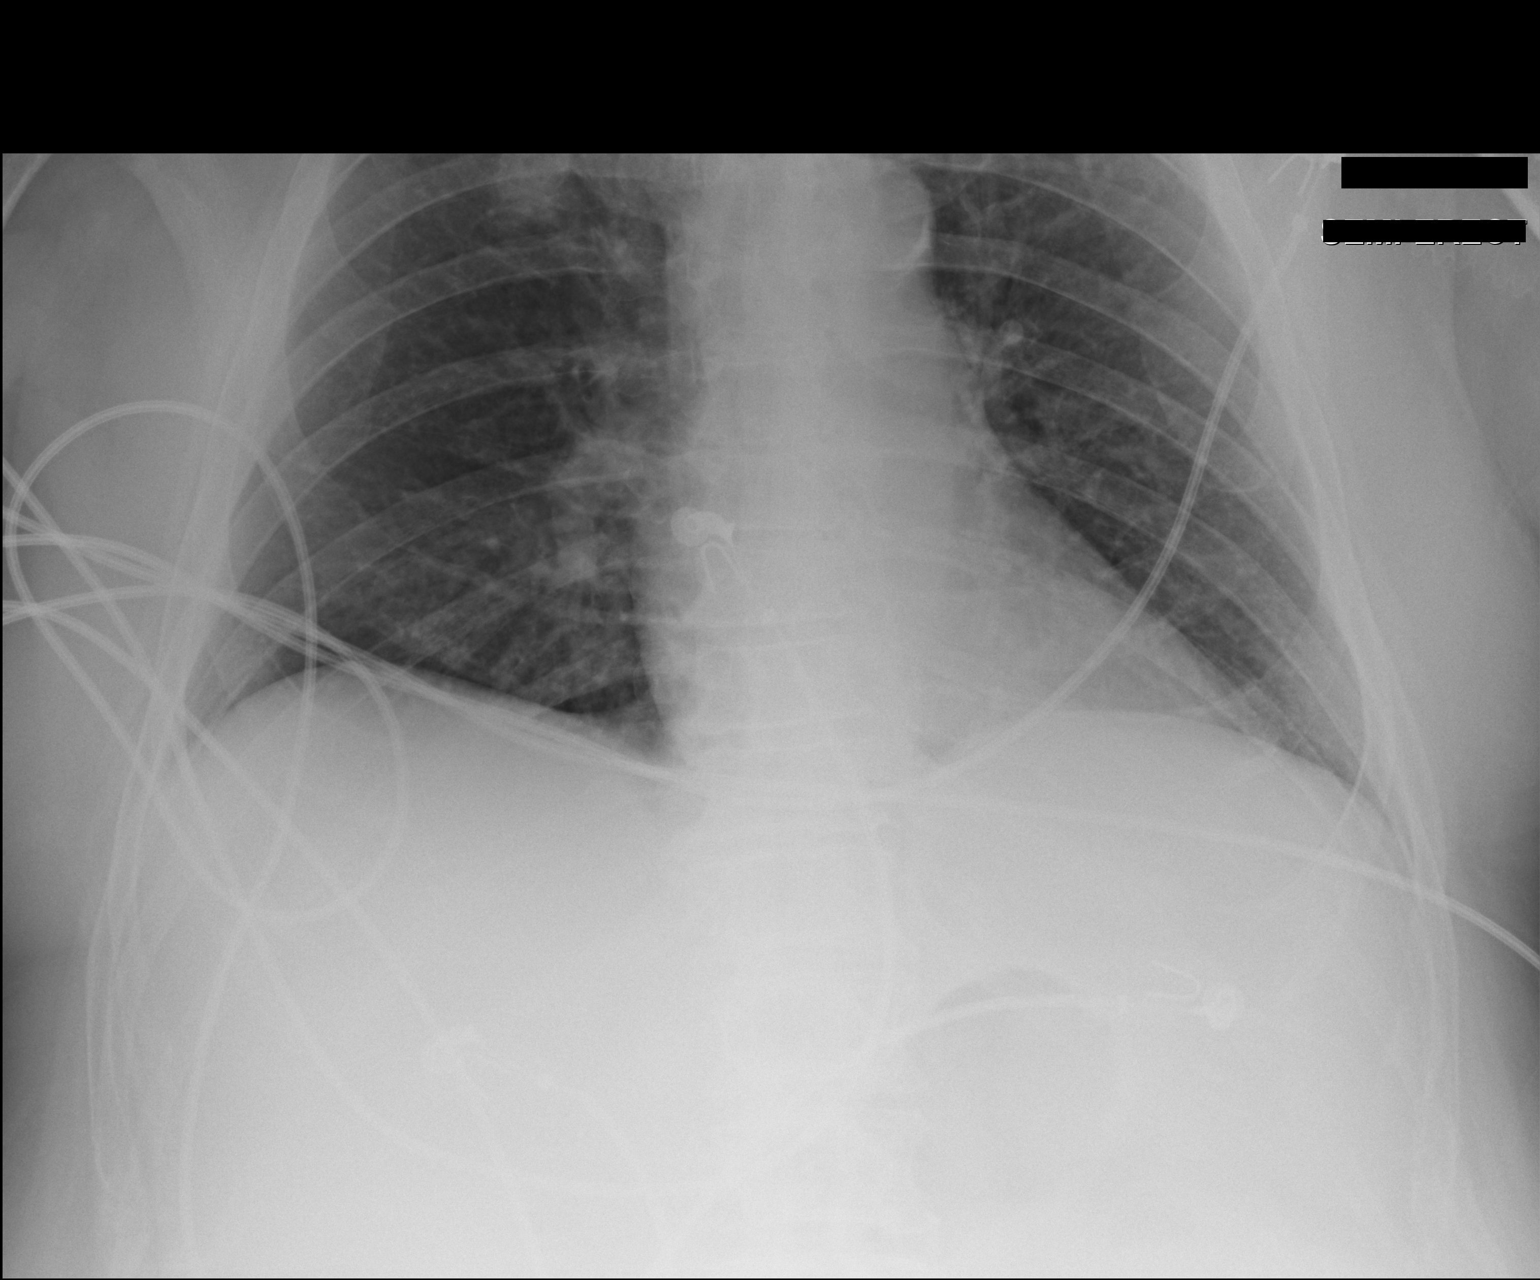

[AP (2 of 2)]
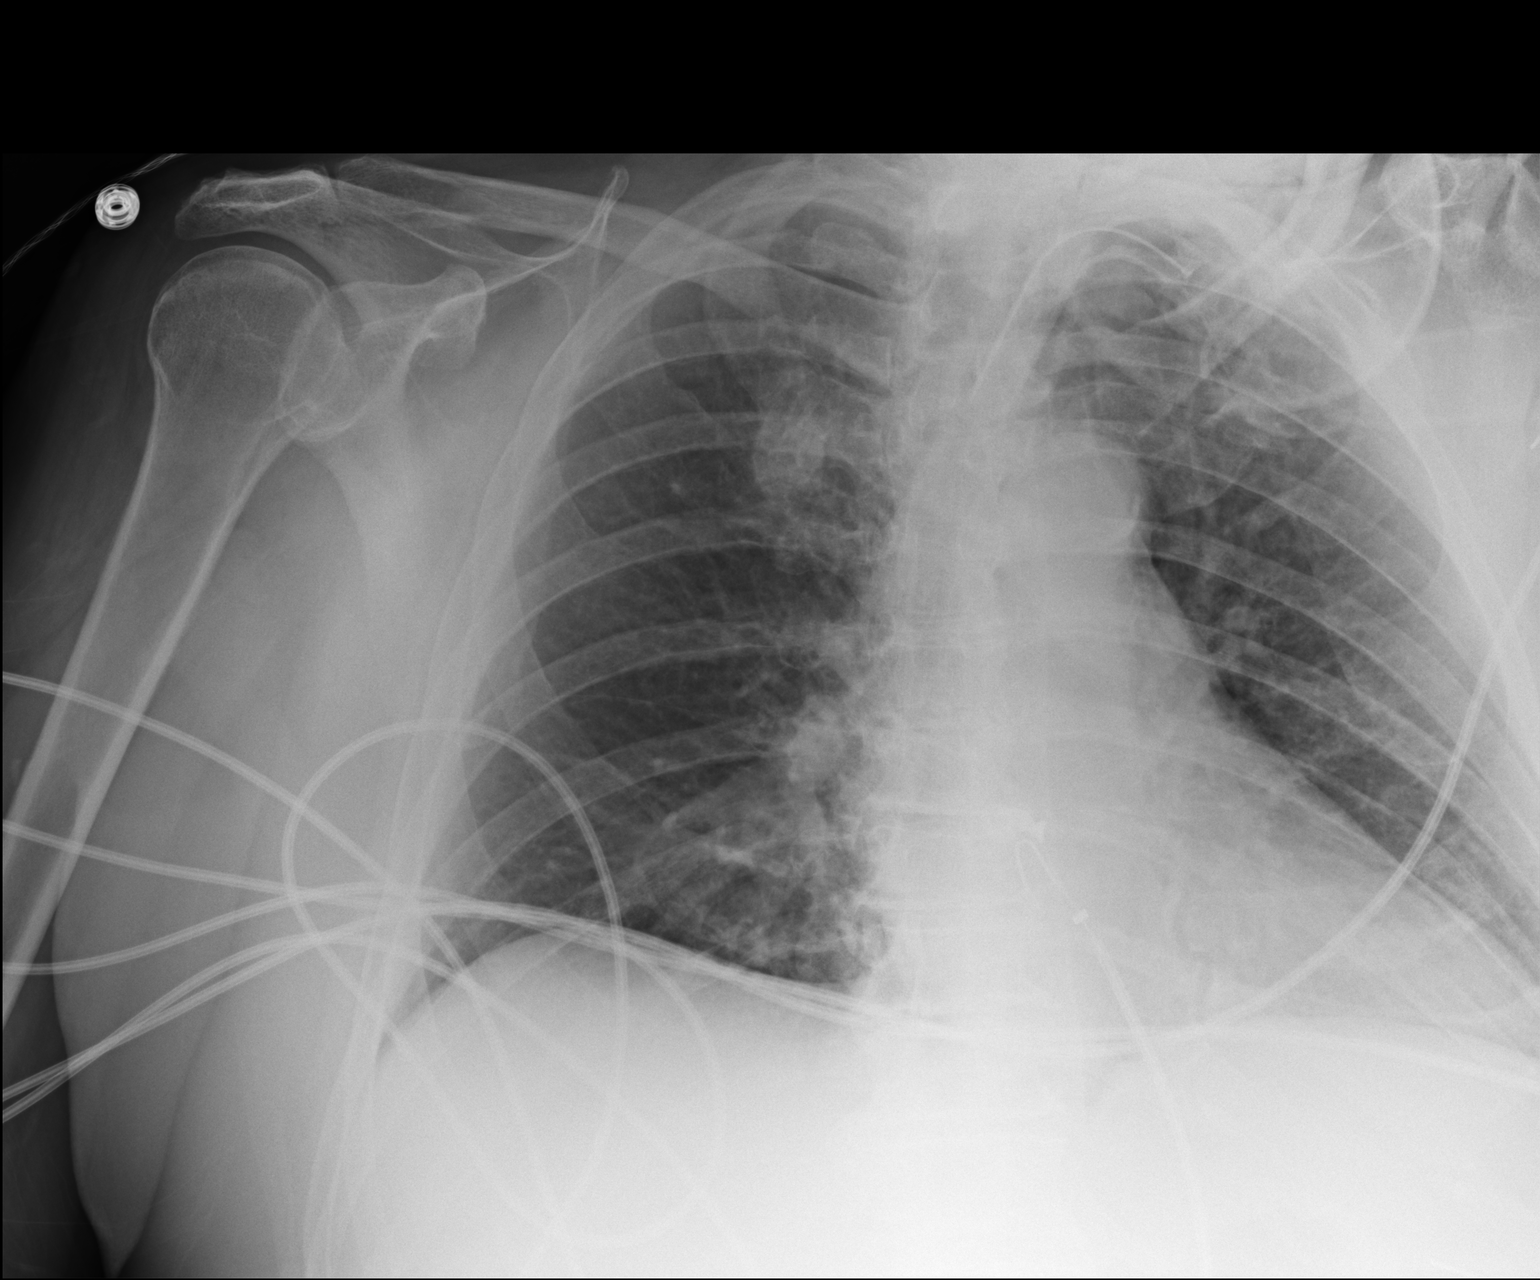

[2 of 2 positions shown; findings below may reference images not displayed]

FINDINGS: Portable AP semi upright view at 9088 hours. Tracheostomy tube
appears satisfactory. The lungs remain clear when allowing for
portable technique. The patient is more rotated to the left.
Mediastinal contours remain normal. Calcified aortic
atherosclerosis. Negative visible bowel gas pattern.
IMPRESSION: No acute cardiopulmonary abnormality.

Calcified aortic atherosclerosis.
